# Patient Record
Sex: Male | Born: 1950 | Race: White | Hispanic: Yes | Smoking: Never smoker
Health system: Southern US, Community
[De-identification: ages and names within clinical notes are randomized; demographics above are authoritative.]

---

## 2006-08-13 ENCOUNTER — Emergency Department (HOSPITAL_COMMUNITY): Admission: EM | Admit: 2006-08-13 | Discharge: 2006-08-14 | Payer: Self-pay | Admitting: Emergency Medicine

## 2014-09-25 ENCOUNTER — Ambulatory Visit: Payer: Self-pay

## 2016-12-29 ENCOUNTER — Ambulatory Visit (INDEPENDENT_AMBULATORY_CARE_PROVIDER_SITE_OTHER): Payer: 59 | Admitting: Urgent Care

## 2016-12-29 ENCOUNTER — Ambulatory Visit (INDEPENDENT_AMBULATORY_CARE_PROVIDER_SITE_OTHER): Payer: 59

## 2016-12-29 VITALS — BP 137/78 | HR 73 | Temp 98.4°F | Resp 17 | Ht 64.5 in | Wt 189.0 lb

## 2016-12-29 DIAGNOSIS — M25562 Pain in left knee: Secondary | ICD-10-CM

## 2016-12-29 DIAGNOSIS — R350 Frequency of micturition: Secondary | ICD-10-CM | POA: Diagnosis not present

## 2016-12-29 DIAGNOSIS — Z9109 Other allergy status, other than to drugs and biological substances: Secondary | ICD-10-CM | POA: Diagnosis not present

## 2016-12-29 LAB — POCT URINALYSIS DIP (MANUAL ENTRY)
BILIRUBIN UA: NEGATIVE
Glucose, UA: NEGATIVE
Ketones, POC UA: NEGATIVE
LEUKOCYTES UA: NEGATIVE
NITRITE UA: NEGATIVE
PH UA: 7
PROTEIN UA: NEGATIVE
Spec Grav, UA: 1.02
Urobilinogen, UA: 0.2

## 2016-12-29 NOTE — Progress Notes (Signed)
MRN: [id number] DOB: June 28, 1951  Subjective:   Nathaniel Knight is a 66 y.o. male presenting for chief complaint of Knee Pain (left side )  Knee pain - Reports 1 week history of worsening left knee pain. Has also had swelling, difficulty bending, lifting. Works in a very physical job. Denies fever, trauma, redness, warmth, knee buckling. Patient has tried otc topical medications for muscle relaxing but nothing oral or topical for pain. Denies smoking cigarettes or drinking alcohol.   Urinary frequency - Reports urinary frequency and urgency daily 5-10 times per day, 2 times nightly. Denies fever, n/v, abdominal pain, hematuria, dysuria. Denies polydipsia, numbness or tingling, blurred vision.  Allergies - Reports longstanding persistent nasal congestion, stopped up nose, difficulty breathing through his nostrils, scratchy throat. Has exposure to dust, allergens at work but recalls that he has always felt this way since his early adulthood. He has tried Careers adviser very occasionally and generally does not take medications. Denies ROS as above, also denies sinus pain, itchy or watery eyes, ear pain, ear drainage, tinnitus, sore throat, cough, chest pain, shob, wheezing.  Nathaniel Knight is not currently taking any medications. Also has No Known Allergies. Nathaniel Knight denies past medical and surgical history. Denies family history of cancer, diabetes, HTN, HL, heart disease, stroke, mental illness.   Objective:   Vitals: BP 137/78 (BP Location: Right Arm, Patient Position: Sitting, Cuff Size: Normal)   Pulse 73   Temp 98.4 F (36.9 C) (Oral)   Resp 17   Ht 5' 4.5" (1.638 m)   Wt 189 lb (85.7 kg)   SpO2 97%   BMI 31.94 kg/m   Physical Exam  Constitutional: He is oriented to person, place, and time. He appears well-developed and well-nourished.  HENT:  TM's intact bilaterally, no effusions or erythema. Nasal turbinates pink and moist, nasal passages patent. No sinus tenderness. Oropharynx with moderate  post-nasal drainage, mucous membranes moist, dentition in good repair.  Eyes: Right eye exhibits no discharge. Left eye exhibits no discharge.  Neck: Normal range of motion. Neck supple.  Cardiovascular: Normal rate, regular rhythm and intact distal pulses.  Exam reveals no gallop and no friction rub.   No murmur heard. Pulmonary/Chest: Effort normal. No respiratory distress. He has no wheezes. He has no rales.  Musculoskeletal:       Left knee: He exhibits swelling (trace, medial). He exhibits normal range of motion, no effusion, no ecchymosis, no deformity, no erythema, normal alignment, normal patellar mobility, no bony tenderness and normal meniscus. Tenderness found. Medial joint line tenderness noted.  Lymphadenopathy:    He has no cervical adenopathy.  Neurological: He is alert and oriented to person, place, and time. He displays normal reflexes.  Skin: Skin is warm and dry.   Dg Knee Complete 4 Views Left  Result Date: 12/29/2016 CLINICAL DATA:  Acute left knee pain without known injury. EXAM: LEFT KNEE - COMPLETE 4+ VIEW COMPARISON:  None. FINDINGS: No evidence of fracture, dislocation, or joint effusion. No evidence of arthropathy or other focal bone abnormality. Soft tissues are unremarkable. IMPRESSION: Normal left knee. Electronically Signed   By: Lupita Raider, M.D.   On: 12/29/2016 11:41   Results for orders placed or performed in visit on 12/29/16 (from the past 24 hour(s))  POCT urinalysis dipstick     Status: Abnormal   Collection Time: 12/29/16 12:08 PM  Result Value Ref Range   Color, UA yellow yellow   Clarity, UA clear clear   Glucose, UA negative negative  Bilirubin, UA negative negative   Ketones, POC UA negative negative   Spec Grav, UA 1.020    Blood, UA trace-intact (A) negative   pH, UA 7.0    Protein Ur, POC negative negative   Urobilinogen, UA 0.2    Nitrite, UA Negative Negative   Leukocytes, UA Negative Negative     Assessment and Plan :   1.  Acute pain of left knee - Suspect inflammatory process likely due to overuse from his level of activity. Will use conservative management, work restrictions. Recheck in 2 weeks.  2. Urinary frequency - Labs pending.  3. Environmental allergies - Start taking Allegra consistently every day. Consider adding nasal steroid.  Wallis BambergMario Asir Bingley, PA-C Primary Care at [facility] [phone] 12/29/2016  11:16 AM

## 2016-12-29 NOTE — Patient Instructions (Addendum)
Tome Tylenol 500mg  cada 6 horas con ibuprofen 400-600mg  cada 6-8 horas para dolor y inflammacion de su rodilla.       Dolor de rodilla (Knee Pain) El dolor de rodilla es un sntoma muy comn y puede tener muchas causas. Suele desaparecer cuando se siguen las indicaciones del mdico en lo que respecta a Engineer, materialsaliviar el dolor y las molestias en la casa. Sin embargo, puede Scientist, product/process developmentprogresar hasta convertirse en una afeccin que requiere Lake Hamiltontratamiento. Algunas afecciones pueden incluir lo siguiente:  Artritis por uso y Chiropractordesgaste (artrosis).  Artritis por hinchazn e irritacin (artritis reumatoide o gota).  Un quiste o un crecimiento en la rodilla.  Una infeccin en la articulacin de la rodilla.  Una lesin que no se Arubacura.  Dao, hinchazn o irritacin de los tejidos que sostienen la rodilla (distensin de ligamentos o tendinitis). Si el dolor de Paramedicrodilla persiste, tal vez haya que realizar ms estudios para Scientist, forensicdiagnosticar la afeccin, los cuales pueden incluir radiografas u otros estudios de diagnstico por imgenes de la rodilla. Adems, es posible que haya que extraerle lquido de la rodilla. El tratamiento del dolor continuo de rodilla depende de la causa, pero puede incluir lo siguiente:  Medicamentos para Engineer, materialsaliviar el dolor o reducir la hinchazn.  Inyecciones de corticoides en la rodilla.  Fisioterapia.  Ciruga. INSTRUCCIONES PARA EL CUIDADO EN EL HOGAR  Tome los medicamentos solamente como se lo haya indicado el mdico.  Mantenga la rodilla en reposo y en alto (elevada) mientras est descansando.  No haga cosas que le causen dolor o que lo intensifiquen.  Evite las actividades o los ejercicios de alto impacto, como correr, Public relations account executivesaltar la soga o hacer saltos de tijera.  Aplique hielo sobre la zona de la rodilla:  Ponga el hielo en una bolsa plstica.  Coloque una toalla entre la piel y la bolsa de hielo.  Coloque el hielo durante 20 minutos, 2 a 3 veces por da.  Pregntele al mdico si  debe usar una Neurosurgeonrodillera elstica.  Cuando duerma, pngase una almohada debajo de la rodilla.  Baje de peso si es necesario. [place] Lacpeso extra puede generar presin en la rodilla.  No consuma ningn producto que contenga tabaco, lo que incluye cigarrillos, tabaco de Theatre managermascar o Administrator, Civil Servicecigarrillos electrnicos. Si necesita ayuda para dejar de fumar, consulte al mdico. Fumar puede retrasar la curacin de cualquier problema que tenga en el hueso y Nurse, learning disabilityla articulacin. SOLICITE ATENCIN MDICA SI:  El dolor de rodilla contina, [place] o empeora.  Tiene fiebre junto con dolor de rodilla.  La rodilla se le tuerce o se le traba.  La rodilla est ms hinchada. SOLICITE ATENCIN MDICA DE INMEDIATO SI:   La articulacin de la rodilla est caliente al tacto.  Tiene dolor en el pecho o dificultad para respirar. Esta informacin no tiene Theme park managercomo fin reemplazar el consejo del mdico. Asegrese de hacerle al mdico cualquier pregunta que tenga. Document Released: 04/05/2008 Document Revised: 11/08/2014 Elsevier Interactive Patient Education  2017 [employer]    IF you received an x-ray today, you will receive an invoice from [facility]. Please contact [facility] at [phone] with questions or concerns regarding your invoice.   IF you received labwork today, you will receive an invoice from [facility]. Please contact [facility] at [phone] with questions or concerns regarding your invoice.   Our billing staff will not be able to assist you with questions regarding bills from these companies.  You will be contacted with the lab results as soon as they are available. The  fastest way to get your results is to activate your My Chart account. Instructions are located on the last page of this paperwork. If you have not heard from [place] regarding the results in 2 weeks, please contact this office.

## 2016-12-30 ENCOUNTER — Ambulatory Visit: Payer: 59 | Admitting: Emergency Medicine

## 2016-12-30 LAB — BASIC METABOLIC PANEL
BUN/Creatinine Ratio: 15 (ref 10–24)
BUN: 12 mg/dL (ref 8–27)
CALCIUM: 8.9 mg/dL (ref 8.6–10.2)
CHLORIDE: 101 mmol/L (ref 96–106)
CO2: 25 mmol/L (ref 18–29)
Creatinine, Ser: 0.82 mg/dL (ref 0.76–1.27)
GFR calc Af Amer: 107 mL/min/{1.73_m2} (ref >59)
GFR, EST NON AFRICAN AMERICAN: 93 mL/min/{1.73_m2} (ref >59)
Glucose: 93 mg/dL (ref 65–99)
POTASSIUM: 4.2 mmol/L (ref 3.5–5.2)
Sodium: 142 mmol/L (ref 134–144)

## 2016-12-30 LAB — HEMOGLOBIN A1C
ESTIMATED AVERAGE GLUCOSE: 111 mg/dL
Hgb A1c MFr Bld: 5.5 % (ref 4.8–5.6)

## 2017-01-03 ENCOUNTER — Ambulatory Visit (INDEPENDENT_AMBULATORY_CARE_PROVIDER_SITE_OTHER): Payer: 59 | Admitting: Physician Assistant

## 2017-01-03 VITALS — BP 116/66 | HR 69 | Temp 97.8°F | Ht 64.5 in | Wt 185.2 lb

## 2017-01-03 DIAGNOSIS — M25562 Pain in left knee: Secondary | ICD-10-CM

## 2017-01-03 NOTE — Progress Notes (Signed)
  01/03/2017 9:09 AM   DOB: May 05, 1951 / MRN: [id number]  SUBJECTIVE:  Nathaniel Knight is a 66 y.o. male presenting for recheck of acute left knee pain last seen by PA Mani on 2/28.  He was advised to rest the knee at that time. 2 weeks later he is here for recheck and tells me that he is ready to go back to work and says his knee fells 100% better. He would like a work note stating that he may return to work.   He has No Known Allergies.   He  has no past medical history on file.    He  reports that he has never smoked. He has never used smokeless tobacco. He reports that he does not drink alcohol or use drugs. He  reports that he does not engage in sexual activity. The patient  has no past surgical history on file.  His family history is not on file.  Review of Systems  Cardiovascular: Negative for leg swelling.  Musculoskeletal: Negative for back pain, falls, joint pain, myalgias and neck pain.    The problem list and medications were reviewed and updated by myself where necessary and exist elsewhere in the encounter.   OBJECTIVE:  BP 116/66   Pulse 69   Temp 97.8 F (36.6 C) (Oral)   Ht 5' 4.5" (1.638 m)   Wt 185 lb 3.2 oz (84 kg)   SpO2 96%   BMI 31.30 kg/m   Physical Exam  Constitutional: He is oriented to person, place, and time. He appears well-developed.  Cardiovascular: Normal rate.   Pulmonary/Chest: Effort normal.  Musculoskeletal: Normal range of motion. He exhibits no edema, tenderness or deformity.       Left knee: He exhibits normal range of motion, no swelling, no effusion, no ecchymosis, no erythema, no LCL laxity, no bony tenderness and no MCL laxity. No tenderness found. No MCL, no LCL and no patellar tendon tenderness noted.  Neurological: He is alert and oriented to person, place, and time. He displays normal reflexes. He exhibits normal muscle tone. Coordination normal.  Skin: He is not diaphoretic.  Vitals reviewed.   No results found for this or any  previous visit (from the past 72 hour(s)).  No results found.  ASSESSMENT AND PLAN:  Nathaniel Knight was seen today for knee pain.  Diagnoses and all orders for this visit:  Acute pain of left knee: Resolved.  RTC as needed. He is back to work without restriction.     The patient is advised to call or return to clinic if he does not see an improvement in symptoms, or to seek the care of the closest emergency department if he worsens with the above plan.   Deliah BostonMichael Zac Torti, MHS, PA-C [facility] 01/03/2017 9:09 AM

## 2017-01-03 NOTE — Patient Instructions (Signed)
     IF you received an x-ray today, you will receive an invoice from [facility]. Please contact [facility] at [phone] with questions or concerns regarding your invoice.   IF you received labwork today, you will receive an invoice from [facility]. Please contact [facility] at [phone] with questions or concerns regarding your invoice.   Our billing staff will not be able to assist you with questions regarding bills from these companies.  You will be contacted with the lab results as soon as they are available. The fastest way to get your results is to activate your My Chart account. Instructions are located on the last page of this paperwork. If you have not heard from us regarding the results in 2 weeks, please contact this office.     

## 2017-12-28 ENCOUNTER — Encounter: Payer: Self-pay | Admitting: Physician Assistant

## 2017-12-28 ENCOUNTER — Ambulatory Visit: Payer: 59 | Admitting: Physician Assistant

## 2017-12-28 ENCOUNTER — Ambulatory Visit (INDEPENDENT_AMBULATORY_CARE_PROVIDER_SITE_OTHER): Payer: 59

## 2017-12-28 VITALS — BP 128/80 | HR 88 | Temp 99.3°F | Resp 17 | Ht 64.5 in | Wt 185.0 lb

## 2017-12-28 DIAGNOSIS — J40 Bronchitis, not specified as acute or chronic: Secondary | ICD-10-CM

## 2017-12-28 DIAGNOSIS — R053 Chronic cough: Secondary | ICD-10-CM

## 2017-12-28 DIAGNOSIS — R05 Cough: Secondary | ICD-10-CM | POA: Diagnosis not present

## 2017-12-28 MED ORDER — ALBUTEROL SULFATE HFA 108 (90 BASE) MCG/ACT IN AERS: 2.0000 | INHALATION_SPRAY | Freq: Four times a day (QID) | RESPIRATORY_TRACT | 0 refills | Status: AC | PRN

## 2017-12-28 MED ORDER — PREDNISONE 20 MG PO TABS: ORAL_TABLET | ORAL | 0 refills | Status: AC

## 2017-12-28 NOTE — Patient Instructions (Addendum)
  You lungs show some brohnchitis.  Let treat the inflammation and use the inhlaer at night as needed.     IF you received an x-ray today, you will receive an invoice from [facility]. Please contact [facility] at [phone] with questions or concerns regarding your invoice.   IF you received labwork today, you will receive an invoice from [facility]. Please contact [facility] at [phone] with questions or concerns regarding your invoice.   Our billing staff will not be able to assist you with questions regarding bills from these companies.  You will be contacted with the lab results as soon as they are available. The fastest way to get your results is to activate your My Chart account. Instructions are located on the last page of this paperwork. If you have not heard from us regarding the results in 2 weeks, please contact this office.

## 2017-12-28 NOTE — Progress Notes (Signed)
12/30/2017 10:10 AM   DOB: Dec 21, 1950 / MRN: [id number]  SUBJECTIVE:  Nathaniel Knight is a 67 y.o. male presenting for cough.  Tells me this acutely worse today.  Symptoms started about 4-5 days ago.  At that time he had fever, body aches, chills, sweats.  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  He has worn a mask before has not been wearing a mask in the last 2 or 3 months.  He denies any history of asthma   He has No Known Allergies.   He  has no past medical history on file.    He  reports that  has never smoked. he has never used smokeless tobacco. He reports that he does not drink alcohol or use drugs. He  reports that he does not engage in sexual activity. The patient  has no past surgical history on file.  His family history is not on file.  Review of Systems  Constitutional: Negative for chills, diaphoresis and fever.  Respiratory: Positive for cough and sputum production. Negative for hemoptysis, shortness of breath and wheezing.   Cardiovascular: Negative for chest pain, orthopnea and leg swelling.  Gastrointestinal: Negative for nausea.  Skin: Negative for rash.  Neurological: Negative for dizziness.    The problem list and medications were reviewed and updated by myself where necessary and exist elsewhere in the encounter.   OBJECTIVE:  BP 128/80   Pulse 88   Temp 99.3 F (37.4 C) (Oral)   Resp 17   Ht 5' 4.5" (1.638 m)   Wt 185 lb (83.9 kg)   SpO2 98%   BMI 31.26 kg/m   Physical Exam  Constitutional: He is oriented to person, place, and time. He appears well-developed. He is active and cooperative.  Non-toxic appearance.  HENT:  Right Ear: Hearing, tympanic membrane, external ear and ear canal normal.  Left Ear: Hearing, tympanic membrane, external ear and ear canal normal.  Nose: Nose normal. Right sinus  exhibits no maxillary sinus tenderness and no frontal sinus tenderness. Left sinus exhibits no maxillary sinus tenderness and no frontal sinus tenderness.  Mouth/Throat: Uvula is midline, oropharynx is clear and moist and mucous membranes are normal. No oropharyngeal exudate, posterior oropharyngeal edema or tonsillar abscesses.  Eyes: Conjunctivae and EOM are normal. Pupils are equal, round, and reactive to light.  Cardiovascular: Normal rate, regular rhythm, S1 normal, S2 normal, normal heart sounds, intact distal pulses and normal pulses. Exam reveals no gallop and no friction rub.  No murmur heard. Pulmonary/Chest: Effort normal. No stridor. No tachypnea. No respiratory distress. He has no wheezes. He has no rales.  Abdominal: He exhibits no distension.  Musculoskeletal: He exhibits no edema.  Lymphadenopathy:       Head (right side): No submandibular and no tonsillar adenopathy present.       Head (left side): No submandibular and no tonsillar adenopathy present.    He has no cervical adenopathy.  Neurological: He is alert and oriented to person, place, and time. He has normal strength and normal reflexes. He is not disoriented. No cranial nerve deficit or sensory deficit. He exhibits normal muscle tone. Coordination and gait normal.  Skin: Skin is warm and dry. He is not diaphoretic. No pallor.  Psychiatric: His behavior is normal.  Vitals reviewed.  Dg Chest 2  View  Result Date: 12/28/2017 CLINICAL DATA:  Cough. EXAM: CHEST  2 VIEW COMPARISON:  None. FINDINGS: Normal sized heart. Minimal bibasilar linear atelectasis or scarring. Mild central peribronchial thickening. Lower thoracic spine degenerative changes. IMPRESSION: Mild bronchitic changes and minimal bibasilar linear atelectasis or scarring. Electronically Signed   By: Beckie SaltsSteven  Reid M.D.   On: 12/28/2017 17:02     No results found for: WBC, HGB, HCT, MCV, PLT  Lab Results  Component Value Date   CREATININE 0.82 12/29/2016    BUN 12 12/29/2016   NA 142 12/29/2016   K 4.2 12/29/2016   CL 101 12/29/2016   CO2 25 12/29/2016    No results found for: ALT, AST, GGT, ALKPHOS, BILITOT  No results found for: TSH  Lab Results  Component Value Date   HGBA1C 5.5 12/29/2016    No results found for: CHOL, HDL, LDLCALC, LDLDIRECT, TRIG, CHOLHDL  No results found for this or any previous visit (from the past 72 hour(s)).  No results found.  ASSESSMENT AND PLAN:  Nathaniel Knight was seen today for uri.  Diagnoses and all orders for this visit:  Chronic coughing: I do advise that he always wear a mask when working in an area that is having an particulate matter.  He agrees to start wearing a mask. -     DG Chest 2 View; Future  Bronchitis: Patient symptoms are consistent with resolving flu.  His lung sounds are normal today and his radiographs are reassuring. -     predniSONE (DELTASONE) 20 MG tablet; Take 3 in the morning for 3 days, then 2 in the morning for 3 days, and then 1 in the morning for 3 days. -     albuterol (PROVENTIL HFA;VENTOLIN HFA) 108 (90 Base) MCG/ACT inhaler; Inhale 2 puffs into the lungs every 6 (six) hours as needed for wheezing or shortness of breath.    The patient is advised to call or return to clinic if he does not see an improvement in symptoms, or to seek the care of the closest emergency department if he worsens with the above plan.   Nathaniel Knight, MHS, PA-C [facility] 12/30/2017 10:10 AM

## 2019-03-12 IMAGING — DX DG CHEST 2V
2 series · 2 of 2 positions shown · non-contrast
Comparison: None.

CLINICAL DATA: Cough.

EXAM:
CHEST  2 VIEW

[chest pa]
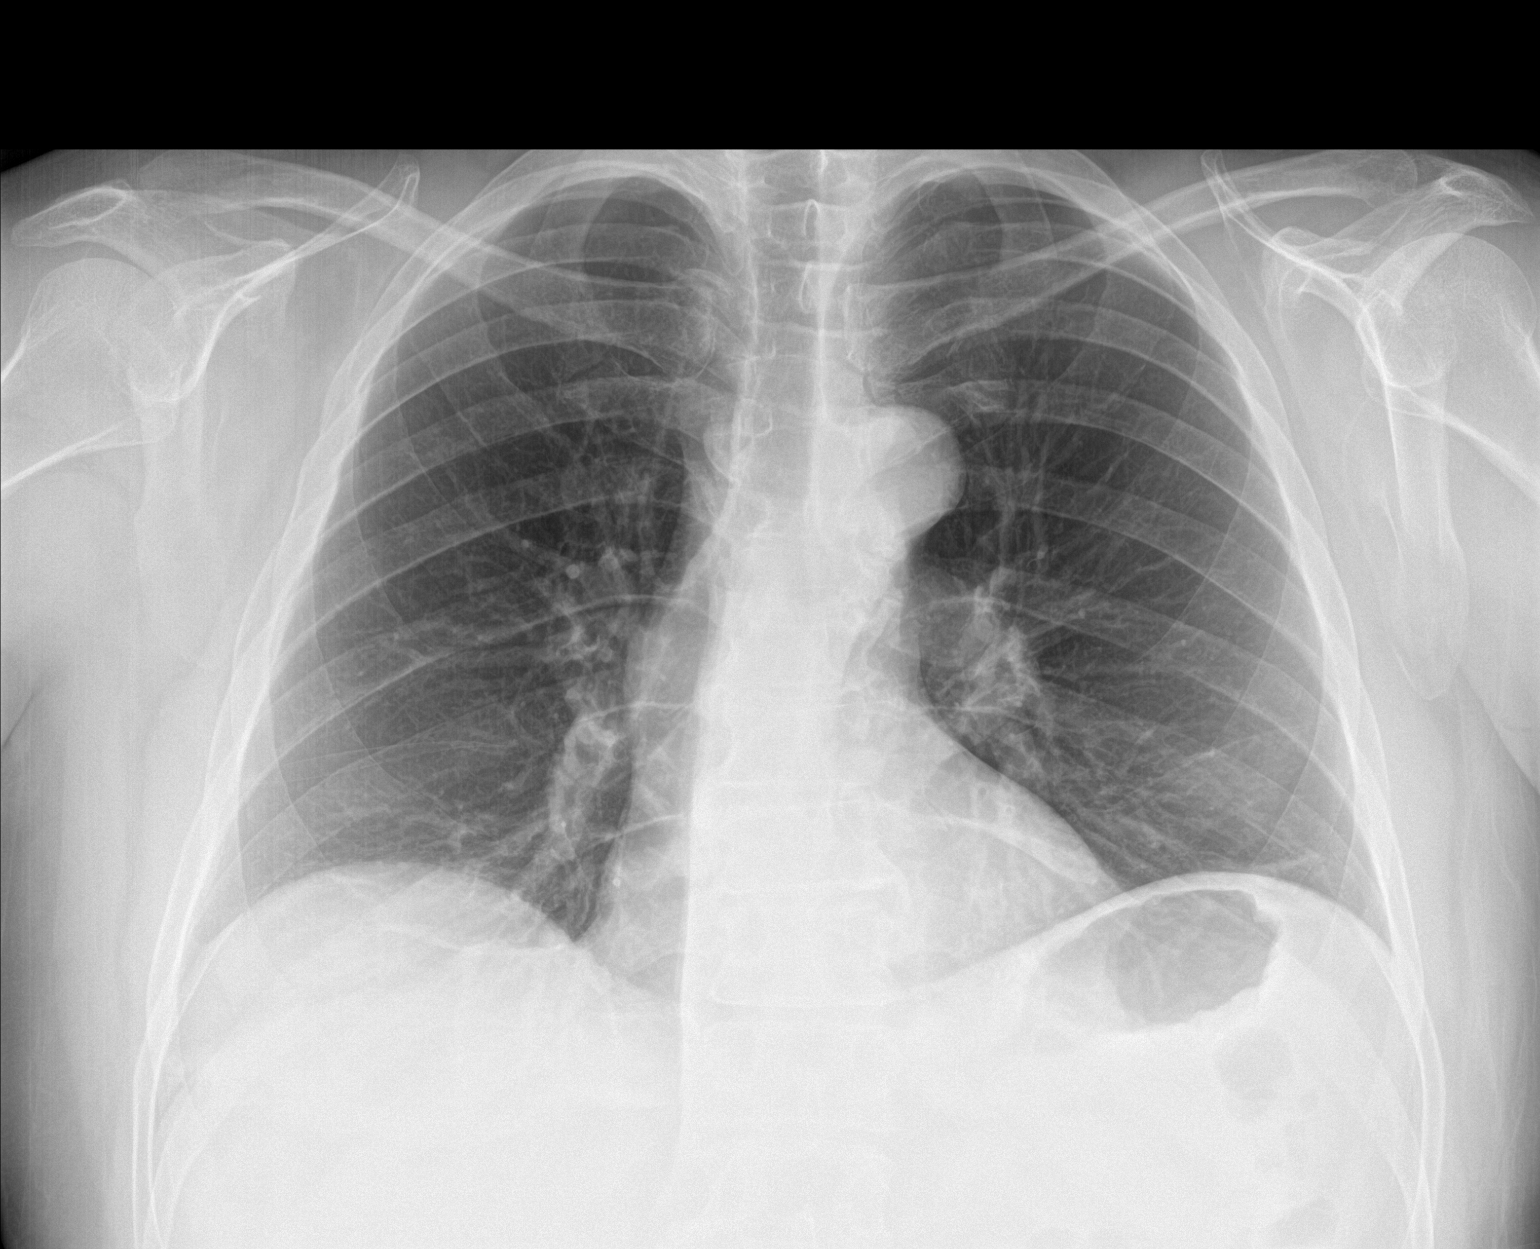

[chest lat]
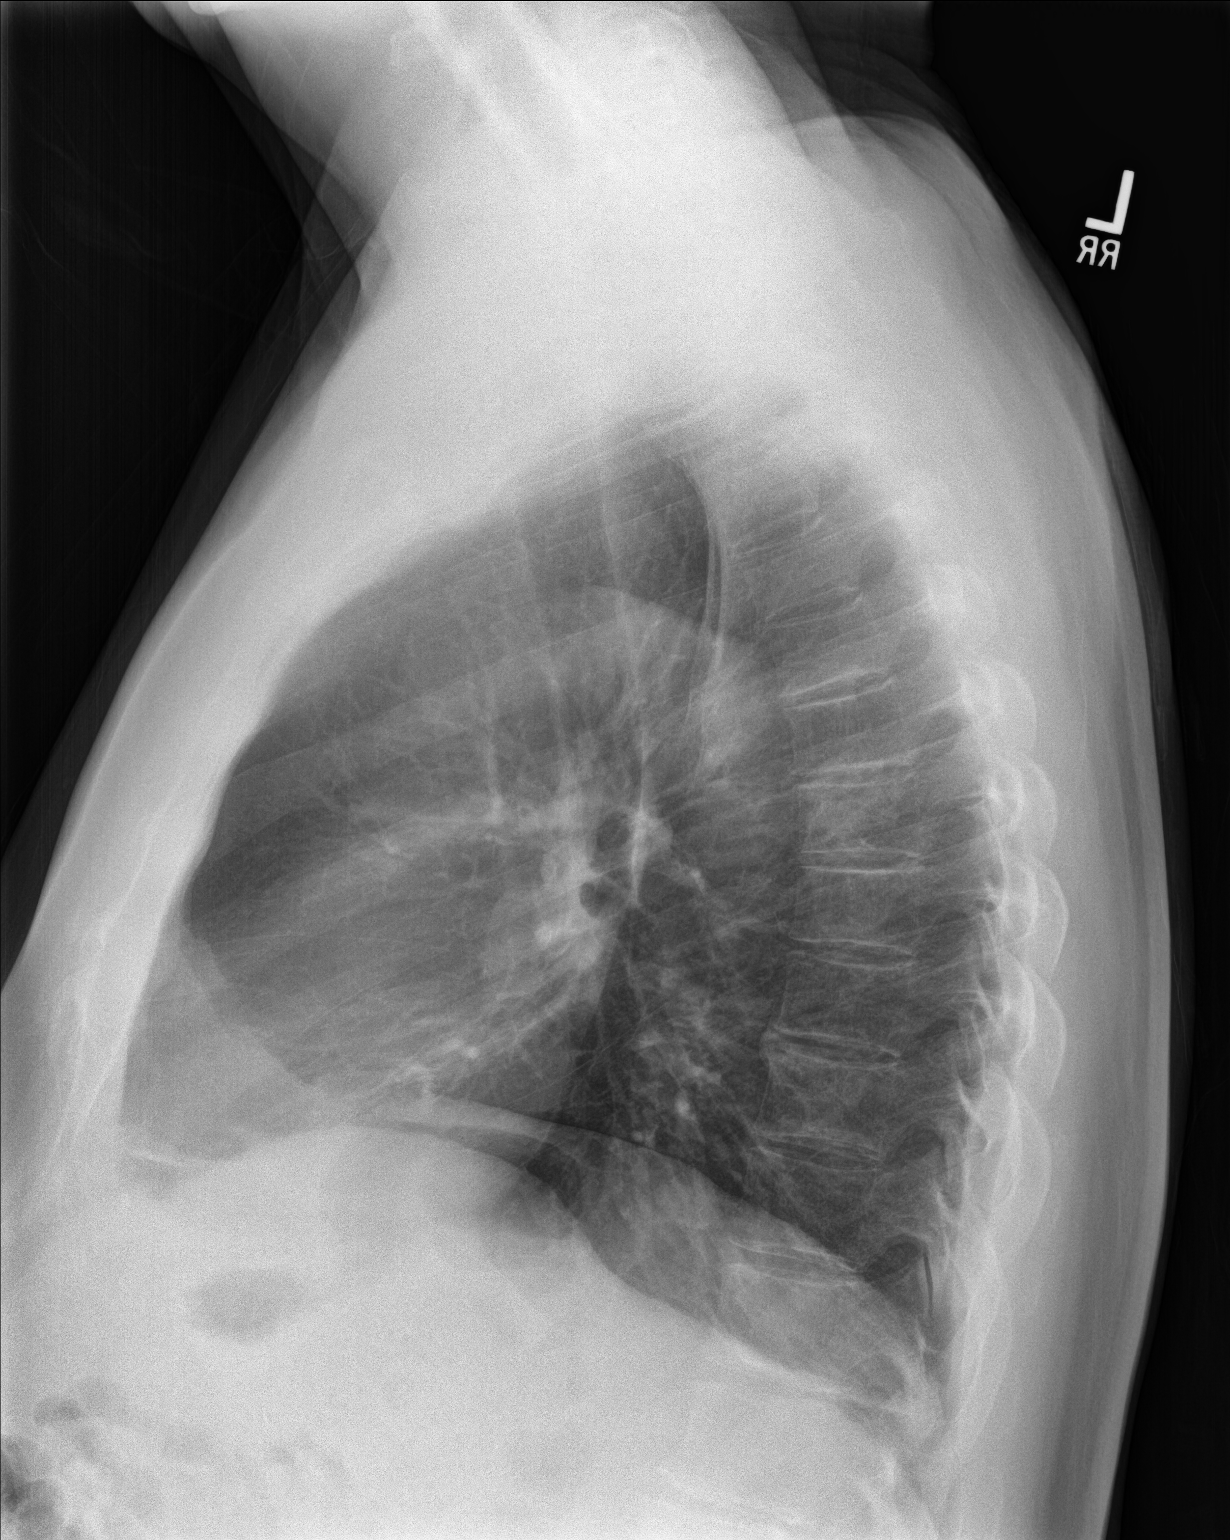

[2 of 2 positions shown; findings below may reference images not displayed]

FINDINGS: Normal sized heart. Minimal bibasilar linear atelectasis or
scarring. Mild central peribronchial thickening. Lower thoracic
spine degenerative changes.
IMPRESSION: Mild bronchitic changes and minimal bibasilar linear atelectasis or
scarring.
